# Patient Record
Sex: Female | Born: 1995 | Race: White | Hispanic: No | Marital: Single | Smoking: Never smoker
Health system: Southern US, Community
[De-identification: ages and names within clinical notes are randomized; demographics above are authoritative.]

## PROBLEM LIST (undated history)

## (undated) DIAGNOSIS — E611 Iron deficiency: Secondary | ICD-10-CM

## (undated) HISTORY — PX: ABDOMINAL SURGERY: SHX537

## (undated) HISTORY — PX: TONSILLECTOMY: SUR1361

## (undated) HISTORY — PX: GASTRIC BYPASS: SHX52

---

## 2016-12-14 ENCOUNTER — Emergency Department (HOSPITAL_COMMUNITY)
Admission: EM | Admit: 2016-12-14 | Discharge: 2016-12-14 | Disposition: A | Discharge status: Home / Self Care | Attending: Emergency Medicine | Admitting: Emergency Medicine

## 2016-12-14 ENCOUNTER — Encounter (HOSPITAL_COMMUNITY): Payer: Self-pay | Admitting: *Deleted

## 2016-12-14 DIAGNOSIS — R6884 Jaw pain: Secondary | ICD-10-CM | POA: Insufficient documentation

## 2016-12-14 DIAGNOSIS — Z5321 Procedure and treatment not carried out due to patient leaving prior to being seen by health care provider: Secondary | ICD-10-CM | POA: Diagnosis not present

## 2016-12-14 DIAGNOSIS — R42 Dizziness and giddiness: Secondary | ICD-10-CM | POA: Diagnosis not present

## 2016-12-14 DIAGNOSIS — R109 Unspecified abdominal pain: Secondary | ICD-10-CM | POA: Insufficient documentation

## 2016-12-14 LAB — BASIC METABOLIC PANEL
Anion gap: 7 (ref 5–15)
BUN: 9 mg/dL (ref 6–20)
CALCIUM: 8.9 mg/dL (ref 8.9–10.3)
CHLORIDE: 108 mmol/L (ref 101–111)
CO2: 24 mmol/L (ref 22–32)
CREATININE: 0.53 mg/dL (ref 0.44–1.00)
GFR calc non Af Amer: >60 mL/min (ref >60)
Glucose, Bld: 83 mg/dL (ref 65–99)
Potassium: 4.1 mmol/L (ref 3.5–5.1)
SODIUM: 139 mmol/L (ref 135–145)

## 2016-12-14 LAB — URINALYSIS, ROUTINE W REFLEX MICROSCOPIC
Bilirubin Urine: NEGATIVE
GLUCOSE, UA: NEGATIVE mg/dL
KETONES UR: NEGATIVE mg/dL
Leukocytes, UA: NEGATIVE
Nitrite: NEGATIVE
PH: 5 (ref 5.0–8.0)
Protein, ur: NEGATIVE mg/dL
SPECIFIC GRAVITY, URINE: 1.019 (ref 1.005–1.030)

## 2016-12-14 LAB — CBC
HCT: 28.3 % — ABNORMAL LOW (ref 36.0–46.0)
Hemoglobin: 8.6 g/dL — ABNORMAL LOW (ref 12.0–15.0)
MCH: 22.9 pg — AB (ref 26.0–34.0)
MCHC: 30.4 g/dL (ref 30.0–36.0)
MCV: 75.5 fL — AB (ref 78.0–100.0)
PLATELETS: 410 10*3/uL — AB (ref 150–400)
RBC: 3.75 MIL/uL — AB (ref 3.87–5.11)
RDW: 15.8 % — AB (ref 11.5–15.5)
WBC: 8.6 10*3/uL (ref 4.0–10.5)

## 2016-12-14 NOTE — ED Triage Notes (Signed)
Pt states she had an abdominoplasty June 25. For three weeks she has been getting lightheaded and feels like she cannot concentrate. Today she had aching arms, stabbing pain in her abdomen and in her left jaw today. She went to urgent care for an area her right thigh that she was concerned about, mentioned symptoms to the doctor there. They did an ekg and saw she had some ekg changes, sent her here for eval. No current pain in her chest or shortness of breath.

## 2016-12-14 NOTE — ED Notes (Addendum)
Pt came to front desk with her mother. Pt informed this RN that she is going to [facility] to be seen. Pt advised to stay and be seen but left post triage. Pt was ambulatory and appeared to be in NAD. IV removed from pt's right arm before leaving.

## 2016-12-14 NOTE — ED Notes (Signed)
Pt states she is unable to urinate at this time. Pt requested hat for toilet, given cup and directed to notify triage staff when she is able to urinate.

## 2017-01-06 ENCOUNTER — Encounter (HOSPITAL_BASED_OUTPATIENT_CLINIC_OR_DEPARTMENT_OTHER): Payer: Self-pay

## 2017-01-06 ENCOUNTER — Emergency Department (HOSPITAL_BASED_OUTPATIENT_CLINIC_OR_DEPARTMENT_OTHER)
Admission: EM | Admit: 2017-01-06 | Discharge: 2017-01-06 | Disposition: A | Discharge status: Home / Self Care | Attending: Emergency Medicine | Admitting: Emergency Medicine

## 2017-01-06 ENCOUNTER — Emergency Department (HOSPITAL_BASED_OUTPATIENT_CLINIC_OR_DEPARTMENT_OTHER)

## 2017-01-06 DIAGNOSIS — Y999 Unspecified external cause status: Secondary | ICD-10-CM | POA: Diagnosis not present

## 2017-01-06 DIAGNOSIS — Y939 Activity, unspecified: Secondary | ICD-10-CM | POA: Diagnosis not present

## 2017-01-06 DIAGNOSIS — S0990XA Unspecified injury of head, initial encounter: Secondary | ICD-10-CM

## 2017-01-06 DIAGNOSIS — W228XXA Striking against or struck by other objects, initial encounter: Secondary | ICD-10-CM | POA: Diagnosis not present

## 2017-01-06 DIAGNOSIS — S0011XA Contusion of right eyelid and periocular area, initial encounter: Secondary | ICD-10-CM | POA: Insufficient documentation

## 2017-01-06 DIAGNOSIS — S0511XA Contusion of eyeball and orbital tissues, right eye, initial encounter: Secondary | ICD-10-CM

## 2017-01-06 DIAGNOSIS — Y929 Unspecified place or not applicable: Secondary | ICD-10-CM | POA: Insufficient documentation

## 2017-01-06 DIAGNOSIS — S0591XA Unspecified injury of right eye and orbit, initial encounter: Secondary | ICD-10-CM | POA: Diagnosis present

## 2017-01-06 HISTORY — DX: Iron deficiency: E61.1
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
## 2017-01-06 NOTE — ED Notes (Signed)
ED Provider at bedside. 

## 2017-01-06 NOTE — Discharge Instructions (Signed)
CT scan normal today. Take ibuprofen or Tylenol for pain. Apply ice packs several times a day. Follow up as needed.
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
## 2019-09-12 IMAGING — CT CT MAXILLOFACIAL W/O CM
3 of 6 series · 16 of 47 positions shown, 19 images · non-contrast
Comparison: None.

CLINICAL DATA: Blunt trauma at work. Bruising and swelling around
the right zygoma and orbit. Initial encounter.

EXAM:
CT MAXILLOFACIAL WITHOUT CONTRAST
TECHNIQUE: Multidetector CT imaging of the maxillofacial structures was
performed. Multiplanar CT image reconstructions were also generated.

[Series 3: max soft · axial · 0.36mm/px · z∈[-408,-228]mm · 11 of 100 slices shown, 14 images]
[im 5/100  brain]
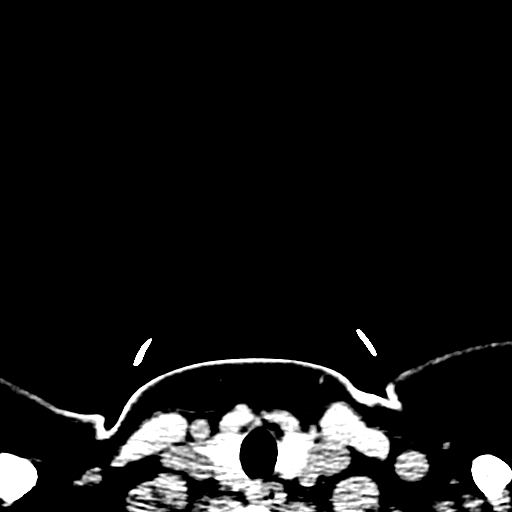
[im 5/100  bone]
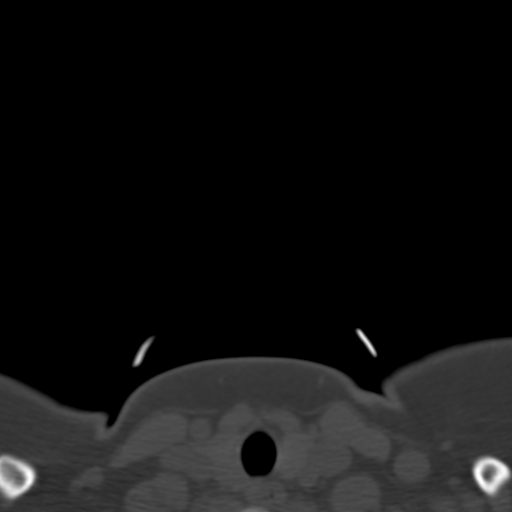
[im 15/100  bone]
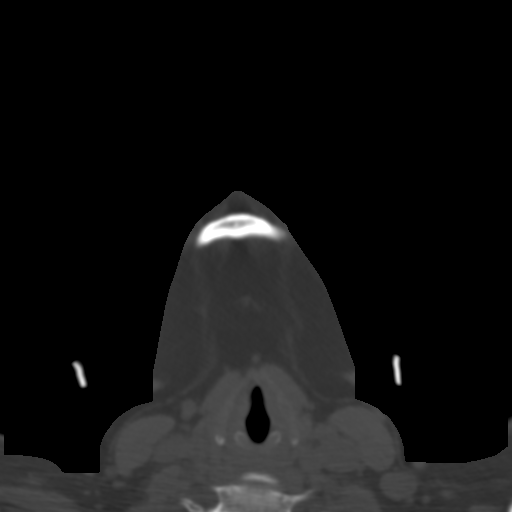
[im 25/100  bone]
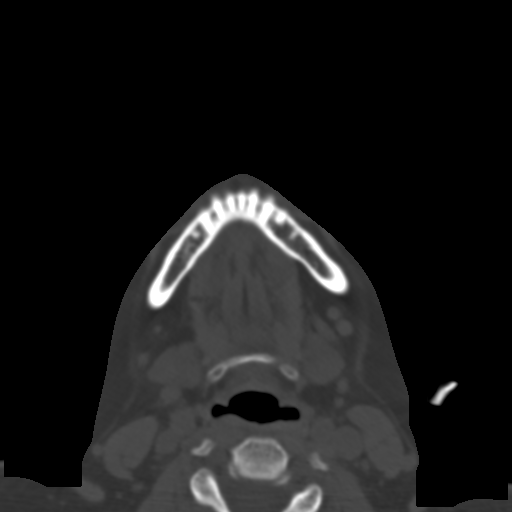
[im 35/100  bone]
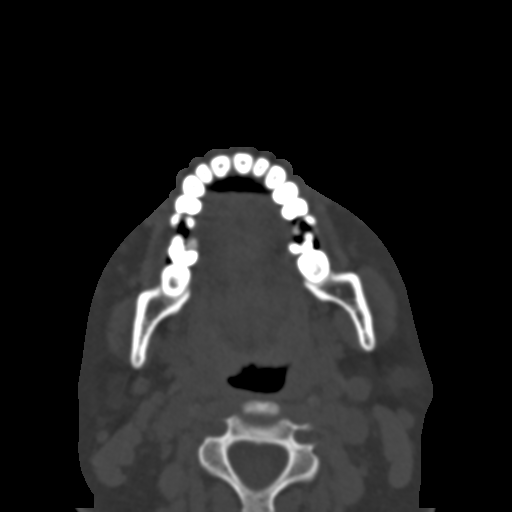
[im 40/100  brain]
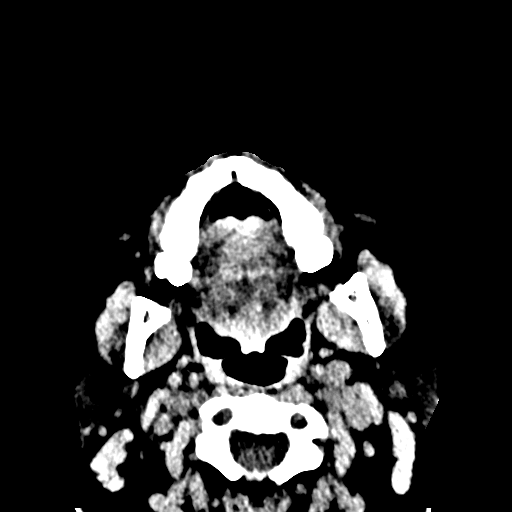
[im 40/100  bone]
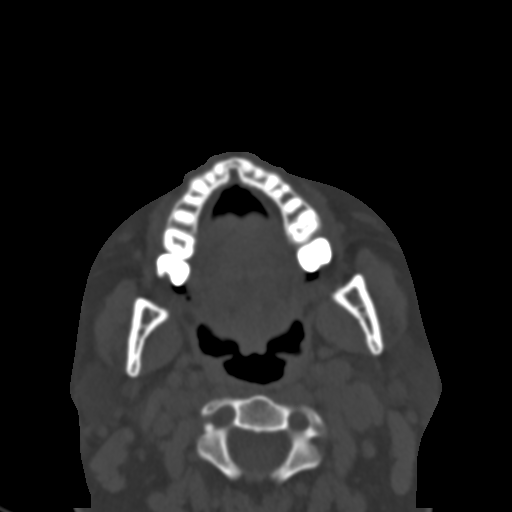
[im 50/100  bone]
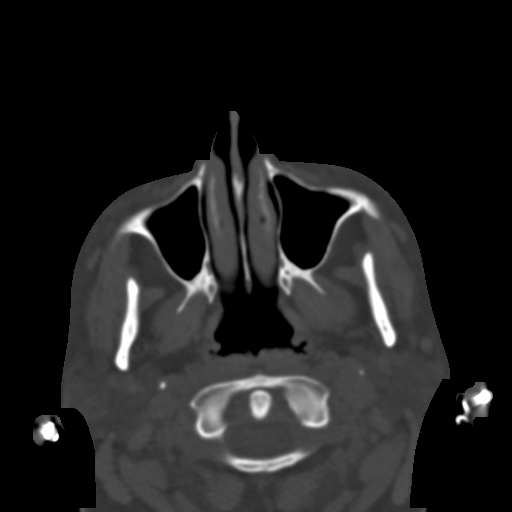
[im 60/100  bone]
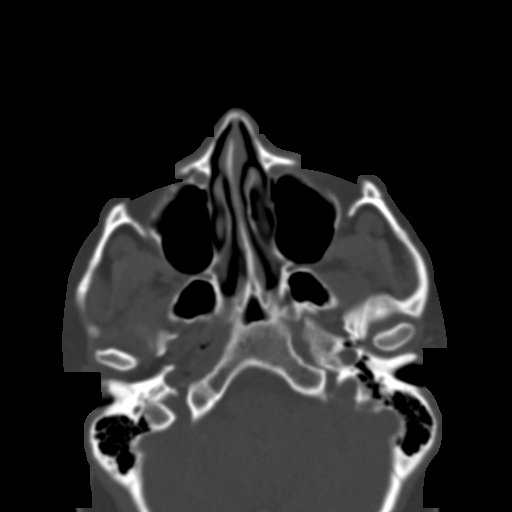
[im 65/100  bone]
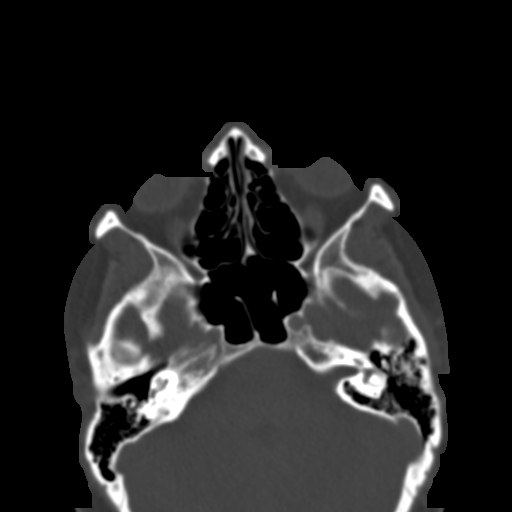
[im 75/100  brain]
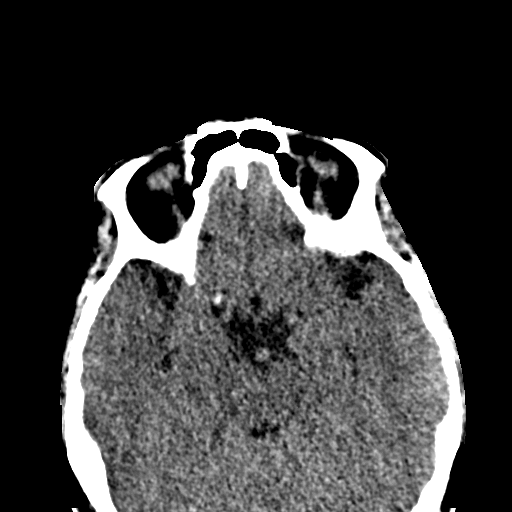
[im 75/100  bone]
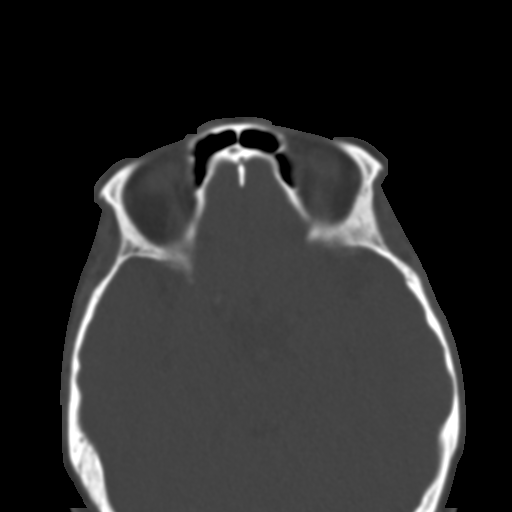
[im 85/100  bone]
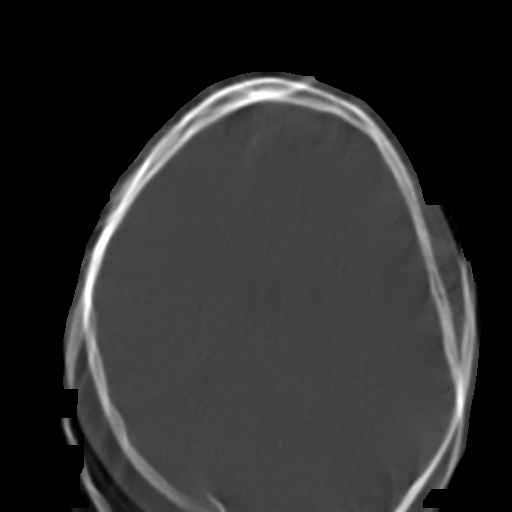
[im 95/100  bone]
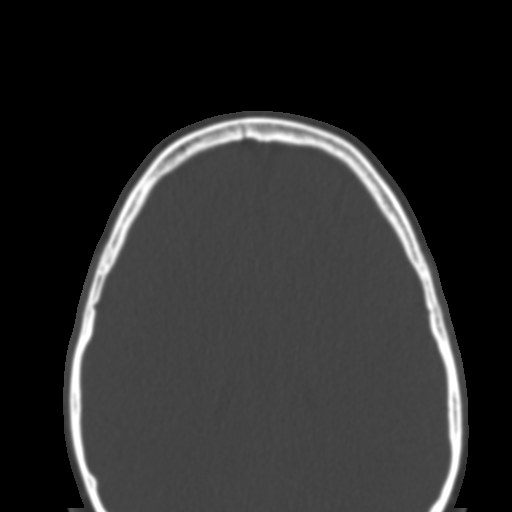

[Series 7: coronal soft · coronal · 0.40mm/px · 3 of 81 slices shown]
[im 21/81  bone]
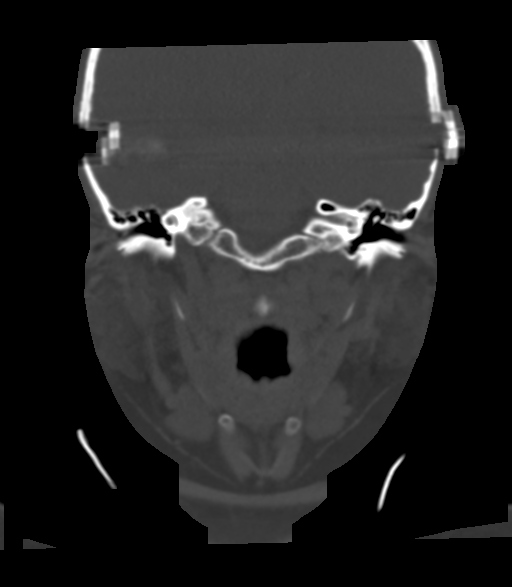
[im 41/81  bone]
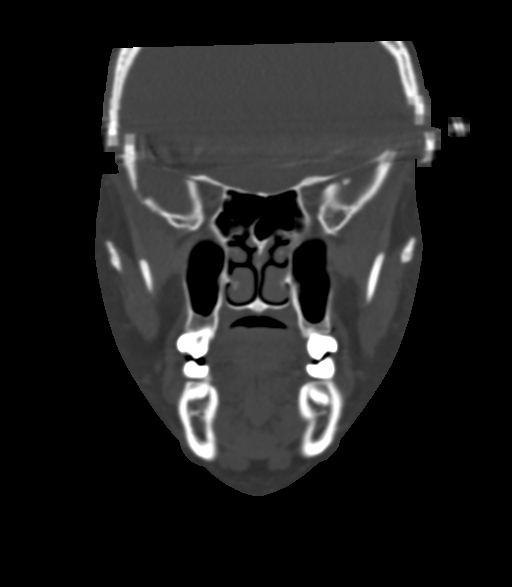
[im 61/81  bone]
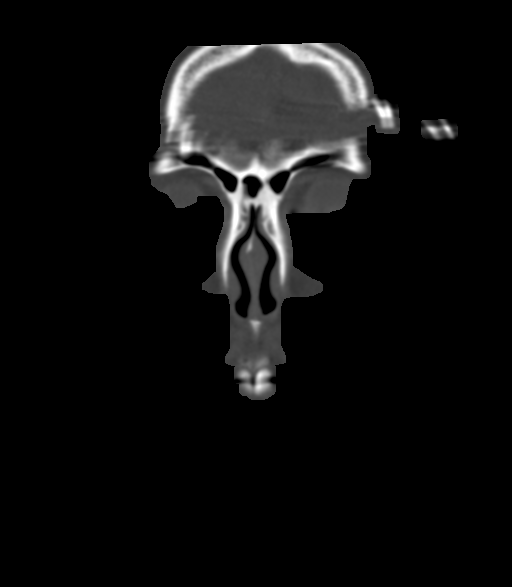

[Series 16: sagittal soft · sagittal · 0.22mm/px · 2 of 111 slices shown]
[im 37/111  bone]
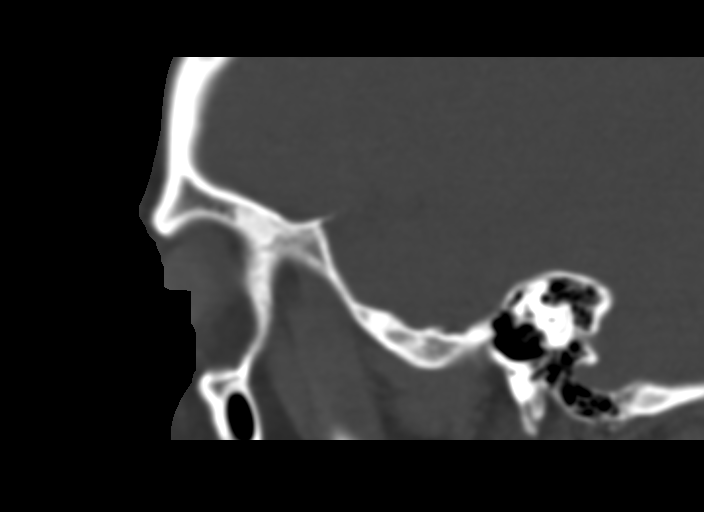
[im 74/111  bone]
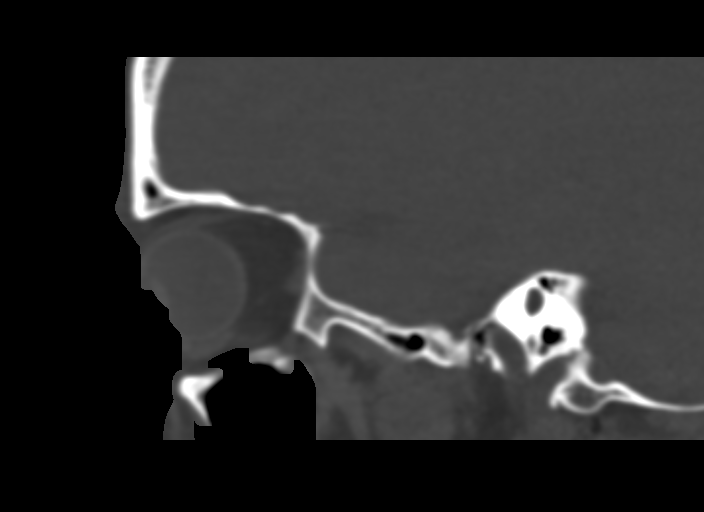

[16 of 47 positions shown; findings below may reference images not displayed]

FINDINGS: Osseous: Negative for fracture or mandibular dislocation.

Orbits: No evidence of injury.

Sinuses: Clear.  No evidence of injury.

Soft tissues: Mild contusion below the right orbit. No opaque
foreign body.

Limited intracranial:    No evidence of injury.
IMPRESSION: Right face contusion without fracture.

## 2022-08-14 ENCOUNTER — Other Ambulatory Visit: Payer: Self-pay

## 2022-08-14 ENCOUNTER — Emergency Department
Admission: EM | Admit: 2022-08-14 | Discharge: 2022-08-14 | Disposition: A | Discharge status: Home / Self Care | Payer: BLUE CROSS/BLUE SHIELD | Attending: Student in an Organized Health Care Education/Training Program | Admitting: Student in an Organized Health Care Education/Training Program

## 2022-08-14 DIAGNOSIS — S01111A Laceration without foreign body of right eyelid and periocular area, initial encounter: Secondary | ICD-10-CM | POA: Insufficient documentation

## 2022-08-14 DIAGNOSIS — Y92410 Unspecified street and highway as the place of occurrence of the external cause: Secondary | ICD-10-CM | POA: Insufficient documentation

## 2022-08-14 MED ORDER — OXYCODONE HCL 5 MG OR TABS
5.0000 mg | ORAL_TABLET | Freq: Four times a day (QID) | ORAL | 0 refills | Status: AC | PRN
Start: 2022-08-14 — End: 2022-08-21

## 2022-08-14 MED ORDER — ACETAMINOPHEN 500 MG OR TABS
1000.0000 mg | ORAL_TABLET | Freq: Four times a day (QID) | ORAL | 0 refills | Status: AC | PRN
Start: 2022-08-14 — End: ?

## 2022-08-14 MED ORDER — TETANUS-DIPHTH-ACELL PERTUSSIS 5-2.5-18.5 LF-MCG/0.5 IM SUSP WRAPPER
0.5000 mL | Freq: Once | INTRAMUSCULAR | Status: DC
Start: 2022-08-14 — End: 2022-08-14
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
## 2022-08-14 NOTE — ED Procedure Notes (Signed)
PROCEDURE    Laceration Repair    Date/Time: 08/14/2022 3:11 PM    Performed by: Burns Spain, MD  Authorized by: Burns Spain, MD    Consent:     Consent obtained:  Verbal    Consent given by:  Patient    Risks discussed:  Infection, need for additional repair, nerve damage, pain, poor wound healing, retained foreign body, tendon damage, vascular damage and poor cosmetic result    Alternatives discussed:  No treatment  Anesthesia (see MAR for exact dosages):     Anesthesia method:  Local infiltration    Local anesthetic:  Lidocaine 1% WITH epi  Laceration details:     Location:  Face    Face location:  R eyebrow    Length (cm):  2.5  Repair type:     Repair type:  Simple  Pre-procedure details:     Preparation:  Patient was prepped and draped in usual sterile fashion  Exploration:     Hemostasis achieved with:  Direct pressure    Wound exploration: wound explored through full range of motion and entire depth of wound probed and visualized      Wound extent: no areolar tissue violation noted, no fascia violation noted, no foreign bodies/material noted, no muscle damage noted, no nerve damage noted, no tendon damage noted, no underlying fracture noted and no vascular damage noted      Contaminated: no    Treatment:     Area cleansed with:  Saline    Amount of cleaning:  Standard    Irrigation solution:  Sterile saline    Irrigation volume:  500cc    Irrigation method:  Pressure wash    Visualized foreign bodies/material removed: no    Skin repair:     Repair method:  Sutures    Suture size:  5-0    Suture material:  Fast-absorbing gut    Suture technique:  Simple interrupted    Number of sutures:  5  Approximation:     Approximation:  Close  Post-procedure details:     Dressing:  Adhesive bandage    Patient tolerance of procedure:  Tolerated well, no immediate complications          Burns Spain, MD  08/14/22 1512
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
## 2022-08-14 NOTE — Discharge Instructions (Signed)
You were evaluated emergency department for the laceration to your right eyebrow which was repaired with 5 absorbable sutures.  These should remain in place for 7 days, can be removed by your primary care doctor, urgent care or the emergency department if they have not by this time.  Anticipate some mild redness around this area along with some bruising, if you have any redness spreading distant from the site, high fevers, purulent drainage or reopening of wound please return for reevaluation
